# Patient Record
Sex: Female | Born: 1985 | Race: White | Hispanic: No | Marital: Single | Smoking: Current every day smoker
Health system: Southern US, Community
[De-identification: ages and names within clinical notes are randomized; demographics above are authoritative.]

---
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
## 2005-01-05 ENCOUNTER — Emergency Department (HOSPITAL_COMMUNITY): Admission: EM | Admit: 2005-01-05 | Discharge: 2005-01-05 | Payer: Self-pay | Admitting: *Deleted

## 2010-09-19 ENCOUNTER — Emergency Department (HOSPITAL_COMMUNITY)
Admission: EM | Admit: 2010-09-19 | Discharge: 2010-09-19 | Disposition: A | Discharge status: Home / Self Care | Payer: 59 | Attending: Emergency Medicine | Admitting: Emergency Medicine

## 2010-09-19 DIAGNOSIS — R209 Unspecified disturbances of skin sensation: Secondary | ICD-10-CM | POA: Insufficient documentation

## 2010-09-19 DIAGNOSIS — M25519 Pain in unspecified shoulder: Secondary | ICD-10-CM | POA: Insufficient documentation

## 2010-09-19 DIAGNOSIS — Z79899 Other long term (current) drug therapy: Secondary | ICD-10-CM | POA: Insufficient documentation

## 2010-09-19 DIAGNOSIS — R5383 Other fatigue: Secondary | ICD-10-CM | POA: Insufficient documentation

## 2010-09-19 DIAGNOSIS — M62838 Other muscle spasm: Secondary | ICD-10-CM | POA: Insufficient documentation

## 2010-09-19 DIAGNOSIS — M542 Cervicalgia: Secondary | ICD-10-CM | POA: Insufficient documentation

## 2010-09-19 DIAGNOSIS — R51 Headache: Secondary | ICD-10-CM | POA: Insufficient documentation

## 2010-09-19 DIAGNOSIS — R259 Unspecified abnormal involuntary movements: Secondary | ICD-10-CM | POA: Insufficient documentation

## 2010-09-19 DIAGNOSIS — M549 Dorsalgia, unspecified: Secondary | ICD-10-CM | POA: Insufficient documentation

## 2010-09-19 DIAGNOSIS — R5381 Other malaise: Secondary | ICD-10-CM | POA: Insufficient documentation

## 2010-09-19 DIAGNOSIS — R10819 Abdominal tenderness, unspecified site: Secondary | ICD-10-CM | POA: Insufficient documentation

## 2010-09-19 LAB — POCT I-STAT, CHEM 8
Creatinine, Ser: 0.7 mg/dL (ref 0.50–1.10)
Glucose, Bld: 93 mg/dL (ref 70–99)
HCT: 48 % — ABNORMAL HIGH (ref 36.0–46.0)
Hemoglobin: 16.3 g/dL — ABNORMAL HIGH (ref 12.0–15.0)
Sodium: 139 mEq/L (ref 135–145)
TCO2: 24 mmol/L (ref 0–100)

## 2010-09-20 NOTE — Consult Note (Signed)
Donna Riddle, Donna Riddle               ACCOUNT NO.:  [id number]  MEDICAL RECORD NO.:  [id number]  LOCATION:  [facility]                         FACILITY:  [facility]  PHYSICIAN:  Levie Heritage, MD       DATE OF BIRTH:  [birth year]  DATE OF CONSULTATION:  09/19/2010 DATE OF DISCHARGE:                                CONSULTATION   REASON FOR CONSULTATION:  Nonspecific neck tics/dystonia.  REQUESTING PHYSICIAN:  Hassan Buckler. Caporossi, MD  HISTORY OF PRESENT ILLNESS:  The patient is a 25 year old female with no significant past medical history, presents to the emergency room at [facility] complaining of nonspecific neck tics/dystonia and left arm weakness and subjective numbness.  The patient reports that her left arm numbness and weakness have been ongoing for more than a week. Does not recall any injury.  She has been seen for this by Dr. Prince Rome, who reportedly performed a chest x-ray which showed a C4-C5 compression. Since then, the patient has been on Benadryl, prednisone, Robaxin and Vicodin which has partially relieved her symptoms.  Note, the patient has also been going to physical therapy with mild relief of her symptom. Since the night of September 18, 2010, she has been having nonspecific muscle twitching of her vertebral muscles.  The patient denies any acute stress in her life and denies taking any antiemetics.  Denies any radiation exposure.  Denies any history of neurodegenerative disease.  PAST MEDICAL HISTORY:  None.  MEDICATIONS:  Robaxin, Vicodin, Benadryl and prednisone.  ALLERGIES:  CODEINE nonspecific reaction.  FAMILY HISTORY:  Significant for hypertension.  No neurological symptoms or disease is known in her family.  SOCIAL HISTORY:  She smokes 1 pack cigarettes a day.  Denies any alcohol or illicit drug use.  She is divorced and lives in [place] with her 21- year-old daughter.  REVIEW OF SYSTEMS:  Negative expect per HPI.  PHYSICAL EXAMINATION:  VITAL SIGNS:   Blood pressure 133/88, pulse 70, respiratory rate 20 and temperature 98.5. MENTAL STATUS:  Alert and oriented x3.  Carries out a two-step commands. CRANIAL NERVES:  Eyes, pupil round and reactive to light.  Extraocular movement intact.  Visual fields intact.  No nystagmus. FACE:  Symmetric.  No facial droop.  No slurred speech.  Facial sensation intact.  Finger-to-nose and heel-to-shin intact.  Gait giveaway weakness on her left side. MOTOR:  Random and sudden jerks of neck in different fashion, not typical for torticollis or myoclonus, possibly tics.  Left arm is giveaway weakness to avoid pain in the neck that obvious weakness otherwise. LUNGS:  Clear to auscultation. CARDIOVASCULAR:  Regular rate and rhythm.  No murmurs, rubs or gallops.  LABORATORY DATA:  Sodium 139, potassium 4.1, chloride 103, bicarb 24, BUN 14, creatinine 0.7 and glucose 93.  Hemoglobin 16.3, hematocrit 48.0.    ASSESSMENT AND PLAN: 4. A 25 year old female with nonspecific neck tics/dystonia.  A     history and clinical scenario is not suggestive of any primary     CNS/PNS pathology.  I would recommend Klonopin 0.5 mg p.o. b.i.d.     for symptomatic treatment and recommend outpatient followup     evaluation if signs and symptoms persist  after several weeks.  The     patient should follow up with Dr. Regino Schultze, telephone number [phone].  I would also recommend nonemergent EEG (doubt partial     seizure however). 2. I would recommend left arm EMG/NCS as an outpatient for questionable     history of C-spine disk issues (though this is not typical either). 3. No additional recommendations at this time.  Thank you very much for your consult.     ______________________________ Levie Heritage, MD     WS/MEDQ  D:  09/19/2010  T:  09/20/2010  Job:  [id number]  Electronically Signed by Levie Heritage MD on 09/20/2010 02:16:51 PM

## 2010-09-26 ENCOUNTER — Encounter: Payer: Self-pay | Admitting: Neurology

## 2010-10-08 ENCOUNTER — Ambulatory Visit: Payer: 59 | Admitting: Neurology

## 2014-02-09 ENCOUNTER — Emergency Department (HOSPITAL_COMMUNITY)
Admission: EM | Admit: 2014-02-09 | Discharge: 2014-02-09 | Disposition: A | Discharge status: Home / Self Care | Payer: Worker's Compensation | Attending: Emergency Medicine | Admitting: Emergency Medicine

## 2014-02-09 ENCOUNTER — Encounter (HOSPITAL_COMMUNITY): Payer: Self-pay | Admitting: Emergency Medicine

## 2014-02-09 ENCOUNTER — Emergency Department (HOSPITAL_COMMUNITY): Payer: Worker's Compensation

## 2014-02-09 DIAGNOSIS — X58XXXA Exposure to other specified factors, initial encounter: Secondary | ICD-10-CM | POA: Diagnosis not present

## 2014-02-09 DIAGNOSIS — S99911A Unspecified injury of right ankle, initial encounter: Secondary | ICD-10-CM | POA: Diagnosis present

## 2014-02-09 DIAGNOSIS — Z720 Tobacco use: Secondary | ICD-10-CM | POA: Diagnosis not present

## 2014-02-09 DIAGNOSIS — Y9289 Other specified places as the place of occurrence of the external cause: Secondary | ICD-10-CM | POA: Diagnosis not present

## 2014-02-09 DIAGNOSIS — Y9389 Activity, other specified: Secondary | ICD-10-CM | POA: Insufficient documentation

## 2014-02-09 DIAGNOSIS — Y998 Other external cause status: Secondary | ICD-10-CM | POA: Diagnosis not present

## 2014-02-09 DIAGNOSIS — S93401A Sprain of unspecified ligament of right ankle, initial encounter: Secondary | ICD-10-CM | POA: Diagnosis not present

## 2014-02-09 NOTE — Discharge Instructions (Signed)
1. Medications: Tylenol as needed for pain, usual home medications 2. Treatment: rest, drink plenty of fluids, ice, elevate, weightbearing as tolerated 3. Follow Up: Please followup with your primary doctor as needed for discussion of your diagnoses and further evaluation after today's visit; if you do not have a primary care doctor use the resource guide provided to find one; Please return to the ER for worsening symptoms, inability to walk,    Ankle Sprain An ankle sprain is an injury to the strong, fibrous tissues (ligaments) that hold the bones of your ankle joint together.  CAUSES An ankle sprain is usually caused by a fall or by twisting your ankle. Ankle sprains most commonly occur when you step on the outer edge of your foot, and your ankle turns inward. People who participate in sports are more prone to these types of injuries.  SYMPTOMS   Pain in your ankle. The pain may be present at rest or only when you are trying to stand or walk.  Swelling.  Bruising. Bruising may develop immediately or within 1 to 2 days after your injury.  Difficulty standing or walking, particularly when turning corners or changing directions. DIAGNOSIS  Your caregiver will ask you details about your injury and perform a physical exam of your ankle to determine if you have an ankle sprain. During the physical exam, your caregiver will press on and apply pressure to specific areas of your foot and ankle. Your caregiver will try to move your ankle in certain ways. An X-ray exam may be done to be sure a bone was not broken or a ligament did not separate from one of the bones in your ankle (avulsion fracture).  TREATMENT  Certain types of braces can help stabilize your ankle. Your caregiver can make a recommendation for this. Your caregiver may recommend the use of medicine for pain. If your sprain is severe, your caregiver may refer you to a surgeon who helps to restore function to parts of your skeletal system  (orthopedist) or a physical therapist. HOME CARE INSTRUCTIONS   Apply ice to your injury for 1-2 days or as directed by your caregiver. Applying ice helps to reduce inflammation and pain.  Put ice in a plastic bag.  Place a towel between your skin and the bag.  Leave the ice on for 15-20 minutes at a time, every 2 hours while you are awake.  Only take over-the-counter or prescription medicines for pain, discomfort, or fever as directed by your caregiver.  Elevate your injured ankle above the level of your heart as much as possible for 2-3 days.  If your caregiver recommends crutches, use them as instructed. Gradually put weight on the affected ankle. Continue to use crutches or a cane until you can walk without feeling pain in your ankle.  If you have a plaster splint, wear the splint as directed by your caregiver. Do not rest it on anything harder than a pillow for the first 24 hours. Do not put weight on it. Do not get it wet. You may take it off to take a shower or bath.  You may have been given an elastic bandage to wear around your ankle to provide support. If the elastic bandage is too tight (you have numbness or tingling in your foot or your foot becomes cold and blue), adjust the bandage to make it comfortable.  If you have an air splint, you may blow more air into it or let air out to make it more  comfortable. You may take your splint off at night and before taking a shower or bath. Wiggle your toes in the splint several times per day to decrease swelling. SEEK MEDICAL CARE IF:   You have rapidly increasing bruising or swelling.  Your toes feel extremely cold or you lose feeling in your foot.  Your pain is not relieved with medicine. SEEK IMMEDIATE MEDICAL CARE IF:  Your toes are numb or blue.  You have severe pain that is increasing. MAKE SURE YOU:   Understand these instructions.  Will watch your condition.  Will get help right away if you are not doing well or get  worse. Document Released: 12/29/2004 Document Revised: 09/23/2011 Document Reviewed: 01/10/2011 [facility] Patient Information 2015 [employer] [place]. This information is not intended to replace advice given to you by your health care provider. Make sure you discuss any questions you have with your health care provider.

## 2014-02-09 NOTE — ED Notes (Signed)
Per [facility], pt fell out of a truck, twisted right ankle, heard a pop. Swelling noted to right ankle. No obvious deformity. Pt able to wiggle toes. Pt states toes are numb. Hurts with movement.

## 2014-02-09 NOTE — ED Provider Notes (Signed)
CSN: [id number]     Arrival date & time 02/09/14  1958 History  This chart was scribed for Dierdre ForthHannah Deniese Oberry, PA-C with Richardean Canalavid H Yao, MD by Tonye RoyaltyJoshua Chen, ED Scribe. This patient was seen in room TR11C/TR11C and the patient's care was started at 9:03 PM.    Chief Complaint  Patient presents with  . Ankle Pain   The history is provided by the patient and medical records. No language interpreter was used.    HPI Comments: Donna CastillaMelissa S Riddle is a 29 y.o. female who presents to the Emergency Department complaining of right ankle pain with onset PTA. She states she mistepped in EMS truck and her ankle rolled to the right side. She describes the pain to her posterior ankle as burning and the pain to the lateral ankle as throbbing. She states all the toes in her right foot are tingling. She is unsure if her heel is numb. She reports one prior right ankle sprain years ago and denies other problems with that ankle. She states she applied ice to it. She states she has not tried to ambulate.   History reviewed. No pertinent past medical history. History reviewed. No pertinent past surgical history. No family history on file. History  Substance Use Topics  . Smoking status: Current Every Day Smoker  . Smokeless tobacco: Not on file  . Alcohol Use: Yes   OB History    No data available     Review of Systems  Constitutional: Negative for fever and chills.  Gastrointestinal: Negative for nausea and vomiting.  Musculoskeletal: Positive for joint swelling and arthralgias. Negative for back pain, neck pain and neck stiffness.  Skin: Negative for wound.  Neurological: Positive for numbness.  Hematological: Does not bruise/bleed easily.  Psychiatric/Behavioral: The patient is not nervous/anxious.   All other systems reviewed and are negative.     Allergies  Codeine; Ambien; Robaxin; and Vicodin  Home Medications   Prior to Admission medications   Medication Sig Start Date End Date Taking?  Authorizing Provider  ClonazePAM (KLONOPIN PO) Take by mouth.   Yes Historical Provider, MD  Meloxicam (MOBIC PO) Take by mouth.   Yes Historical Provider, MD  TRAMADOL HCL PO Take by mouth.   Yes Historical Provider, MD   BP 122/67 mmHg  Pulse 71  Temp(Src) 98.3 F (36.8 C) (Oral)  Resp 16  Ht 5\' 1"  (1.549 m)  Wt 170 lb (77.111 kg)  BMI 32.14 kg/m2  SpO2 97%  LMP 01/12/2014 Physical Exam  Constitutional: She appears well-developed and well-nourished. No distress.  HENT:  Head: Normocephalic and atraumatic.  Eyes: Conjunctivae are normal.  Neck: Normal range of motion.  Cardiovascular: Normal rate, regular rhythm and intact distal pulses.   Capillary refill < 3 sec  Pulmonary/Chest: Effort normal and breath sounds normal.  Musculoskeletal: She exhibits tenderness. She exhibits no edema.  ROM: decreased flexion and extension in right ankle Tenderness to palption of the lateral and medial joint line Mild swelling over lateral malleolus Mild tenderness to palpation of the achilles tendon without palpable defect  Negative Thompson's test  Neurological: She is alert. Coordination normal.  Sensation intact to dull and sharp in RLE Strength 2/5 in the RLE with resisted dorsiflexion and plantarflexion due to pain  Skin: Skin is warm and dry. She is not diaphoretic.  No tenting of the skin  Psychiatric: She has a normal mood and affect.  Nursing note and vitals reviewed.   ED Course  Procedures (including critical care time)  DIAGNOSTIC STUDIES: Oxygen Saturation is 98% on room air, normal by my interpretation.    COORDINATION OF CARE: 9:12 PM Discussed treatment plan with patient at beside, including brace and crutches. She declines pain medication. The patient agrees with the plan and has no further questions at this time.   Labs Review Labs Reviewed - No data to display  Imaging Review Dg Ankle Complete Right  02/09/2014   CLINICAL DATA:  Patient rolled ankle stepping  out of truck. Pain laterally  EXAM: RIGHT ANKLE - COMPLETE 3+ VIEW  COMPARISON:  None.  FINDINGS: Frontal, oblique, and lateral views were obtained. There is mild swelling laterally. There is no appreciable fracture or joint effusion. Ankle mortise appears intact. No erosive change.  IMPRESSION: Mild swelling laterally.  No fracture.  Mortise intact   Electronically Signed   By: Bretta Bang M.D.   On: 02/09/2014 20:26     EKG Interpretation None      MDM   Final diagnoses:  Right ankle sprain, initial encounter   Donna Riddle presents with right ankle pain.  Patient X-Ray negative for obvious fracture or dislocation. Pain managed in ED. Pt advised to follow up with orthopedics if symptoms persist for possibility of missed fracture diagnosis. Patient given brace while in ED, conservative therapy recommended and discussed. Patient will be dc home & is agreeable with above plan.  I have personally reviewed patient's vitals, nursing note and any pertinent labs or imaging.  I performed an undressed physical exam.    It has been determined that no acute conditions requiring further emergency intervention are present at this time. The patient/guardian have been advised of the diagnosis and plan. I reviewed all labs and imaging including any potential incidental findings. We have discussed signs and symptoms that warrant return to the ED and they are listed in the discharge instructions.    Vital signs are stable at discharge.   BP 122/67 mmHg  Pulse 71  Temp(Src) 98.3 F (36.8 C) (Oral)  Resp 16  Ht  (1.549 m)  Wt 170 lb (77.111 kg)  BMI 32.14 kg/m2  SpO2 97%  LMP 01/12/2014  I personally performed the services described in this documentation, which was scribed in my presence. The recorded information has been reviewed and is accurate.    Dahlia Client Rodolfo Notaro, PA-C 02/09/14 2148  Richardean Canal, MD 02/09/14 2352

## 2014-02-09 NOTE — Progress Notes (Signed)
Orthopedic Tech Progress Note Patient Details:  Donna CastillaMelissa S Riddle 1985/10/10 [id number]  Ortho Devices Type of Ortho Device: ASO, Crutches Ortho Device/Splint Location: RLE Ortho Device/Splint Interventions: Ordered, Application   [name], [name] 02/09/2014, 9:29 PM

## 2017-08-10 ENCOUNTER — Ambulatory Visit (INDEPENDENT_AMBULATORY_CARE_PROVIDER_SITE_OTHER): Payer: 59 | Admitting: Orthopaedic Surgery

## 2017-08-10 ENCOUNTER — Ambulatory Visit (INDEPENDENT_AMBULATORY_CARE_PROVIDER_SITE_OTHER): Payer: 59

## 2017-08-10 DIAGNOSIS — M25571 Pain in right ankle and joints of right foot: Secondary | ICD-10-CM

## 2017-08-10 NOTE — Progress Notes (Signed)
Office Visit Note   Patient: Donna Riddle           Date of Birth: January 31, 1985           MRN: [id number] Visit Date: 08/10/2017              Requested by: No referring provider defined for this encounter. PCP: Patient, No Pcp Per   Assessment & Plan: Visit Diagnoses:  1. Pain in right ankle and joints of right foot     Plan: Impression is chronic right ankle pain suspicious for osteochondral lesion versus intra-articular loose bodies from previous ankle joint.  Patient has failed conservative treatment with physical therapy and bracing.  At this point recommend MRI to rule out structural abnormalities.  Follow-up after the MRI.  Follow-Up Instructions: Return in about 2 weeks (around 08/24/2017).   Orders:  Orders Placed This Encounter  Procedures  . XR Ankle Complete Right  . MR Ankle Right w/o contrast   No orders of the defined types were placed in this encounter.     Procedures: No procedures performed   Clinical Data: No additional findings.   Subjective: Chief Complaint  Patient presents with  . Right Ankle - Pain    This is a 32 year old female comes in with 3-year history of right ankle pain that is worse on the anterolateral mechanical swelling.  She originally had an injury to her right ankle about 3 years ago that was treated conservatively with physical therapy and rest.  This failed to improve significantly.  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  She endorses swelling.  Denies any numbness and tingling.  She also has discomfort and pain posteriorly along the Achilles.   Review of Systems  Constitutional: Negative.   HENT: Negative.   Eyes: Negative.   Respiratory: Negative.   Cardiovascular: Negative.   Endocrine: Negative.   Musculoskeletal: Negative.   Neurological: Negative.   Hematological: Negative.   Psychiatric/Behavioral: Negative.   All other systems reviewed and  are negative.    Objective: Vital Signs: There were no vitals taken for this visit.  Physical Exam  Constitutional: She is oriented to person, place, and time. She appears well-developed and well-nourished.  HENT:  Head: Normocephalic and atraumatic.  Eyes: EOM are normal.  Neck: Neck supple.  Pulmonary/Chest: Effort normal.  Abdominal: Soft.  Neurological: She is alert and oriented to person, place, and time.  Skin: Skin is warm. Capillary refill takes less than 2 seconds.  Psychiatric: She has a normal mood and affect. Her behavior is normal. Judgment and thought content normal.  Nursing note and vitals reviewed.   Ortho Exam Right ankle exam shows no significant swelling or asymmetry.  She does have tenderness along the lateral ankle gutter and ATFL ligament.  Anterior drawer is stable but there is significant crepitus and grinding that causes pain.  She does have a mild Achilles contracture. Specialty Comments:  No specialty comments available.  Imaging: Xr Ankle Complete Right  Result Date: 08/10/2017 Cortical irregularity in the lateral gutter concerning for loose body or osteochondral lesion    PMFS History: There are no active problems to display for this patient.  No past medical history on file.  No family history on file.  No past surgical history on file. Social History   Occupational History  . Not on file  Tobacco Use  . Smoking status: Current Every Day Smoker  Substance  and Sexual Activity  . Alcohol use: Yes  . Drug use: Not on file  . Sexual activity: Not on file

## 2017-08-11 ENCOUNTER — Telehealth (INDEPENDENT_AMBULATORY_CARE_PROVIDER_SITE_OTHER): Payer: Self-pay | Admitting: Orthopaedic Surgery

## 2017-08-11 NOTE — Telephone Encounter (Signed)
Sedentary work for now

## 2017-08-11 NOTE — Telephone Encounter (Signed)
Please advise,

## 2017-08-11 NOTE — Telephone Encounter (Signed)
Patient called needing a note for work because she can not fit into her boot. Patient asked what does Dr Roda ShuttersXu want her do concerning returning back to work. Patient said she is on herr feet all the time and have to left 65 to 70 lbs of equipment. The number to contact patient is [phone]

## 2017-08-12 ENCOUNTER — Encounter (INDEPENDENT_AMBULATORY_CARE_PROVIDER_SITE_OTHER): Payer: Self-pay

## 2017-08-12 NOTE — Telephone Encounter (Signed)
Emailed to [email or web address] per patients request.

## 2017-08-20 ENCOUNTER — Ambulatory Visit
Admission: RE | Admit: 2017-08-20 | Discharge: 2017-08-20 | Disposition: A | Discharge status: Home / Self Care | Payer: 59 | Source: Ambulatory Visit | Attending: Orthopaedic Surgery | Admitting: Orthopaedic Surgery

## 2017-08-20 DIAGNOSIS — M25571 Pain in right ankle and joints of right foot: Secondary | ICD-10-CM

## 2017-08-25 ENCOUNTER — Encounter (INDEPENDENT_AMBULATORY_CARE_PROVIDER_SITE_OTHER): Payer: Self-pay | Admitting: Orthopaedic Surgery

## 2017-08-25 ENCOUNTER — Ambulatory Visit (INDEPENDENT_AMBULATORY_CARE_PROVIDER_SITE_OTHER): Payer: 59 | Admitting: Orthopaedic Surgery

## 2017-08-25 DIAGNOSIS — M25571 Pain in right ankle and joints of right foot: Secondary | ICD-10-CM | POA: Diagnosis not present

## 2017-08-25 NOTE — Progress Notes (Signed)
   Office Visit Note   Patient: Donna Riddle           Date of Birth: 08/26/1985           MRN: [id number] Visit Date: 08/25/2017              Requested by: No referring provider defined for this encounter. PCP: Patient, No Pcp Per   Assessment & Plan: Visit Diagnoses:  1. 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  Given these findings I would like to refer her to Dr. Lajoyce Cornersuda for discussion of possible surgery.  Sedentary work note given today.  Patient in agreement with the plan.  Based on patient's history it sounds like this was a work-related injury.  Follow-Up Instructions: Return if symptoms worsen or fail to improve.   Orders:  No orders of the defined types were placed in this encounter.  No orders of the defined types were placed in this encounter.     Procedures: No procedures performed   Clinical Data: No additional findings.   Subjective: No chief complaint on file.   Donna Riddle is here to review her MRI.   Review of Systems   Objective: Vital Signs: There were no vitals taken for this visit.  Physical Exam  Ortho Exam Right ankle exam shows pain with palpation in the ATFL region as well as a mildly positive anterior drawer. Specialty Comments:  No specialty comments available.  Imaging: No results found.   PMFS History: There are no active problems to display for this patient.  No past medical history on file.  No family history on file.  No past surgical history on file. Social History   Occupational History  . Not on file  Tobacco Use  . Smoking status: Current Every Day Smoker  Substance and Sexual Activity  . Alcohol use: Yes  . Drug use: Not on file  . Sexual activity: Not on file

## 2017-08-30 ENCOUNTER — Ambulatory Visit (INDEPENDENT_AMBULATORY_CARE_PROVIDER_SITE_OTHER): Payer: 59 | Admitting: Orthopedic Surgery

## 2017-08-30 ENCOUNTER — Encounter (INDEPENDENT_AMBULATORY_CARE_PROVIDER_SITE_OTHER): Payer: Self-pay | Admitting: Orthopedic Surgery

## 2017-08-30 VITALS — Ht 61.0 in | Wt 170.0 lb

## 2017-08-30 DIAGNOSIS — M25571 Pain in right ankle and joints of right foot: Secondary | ICD-10-CM | POA: Diagnosis not present

## 2017-08-30 MED ORDER — METHYLPREDNISOLONE ACETATE 40 MG/ML IJ SUSP
40.0000 mg | INTRAMUSCULAR | Status: AC | PRN
Start: 2017-08-30 — End: 2017-08-30
  Administered 2017-08-30: 40 mg via INTRA_ARTICULAR

## 2017-08-30 MED ORDER — LIDOCAINE HCL 1 % IJ SOLN
2.0000 mL | INTRAMUSCULAR | Status: AC | PRN
  Administered 2017-08-30: 2 mL

## 2017-08-30 NOTE — Progress Notes (Signed)
Office Visit Note   Patient: Donna Riddle           Date of Birth: [birth year]           MRN: [id number] Visit Date: 08/30/2017              Requested by: No referring provider defined for this encounter. PCP: Patient, No Pcp Per  Chief Complaint  Patient presents with  . Right Ankle - Pain      HPI: Patient is a 32 year old woman who was seen for initial evaluation of chronic right ankle pain anterior laterally.  Patient states that 3 years ago she had a bad sprain when she got out of the truck she stepped on a great the great broke and her foot fell down about 5 to 6 inches.  Patient states she has had pain anteriorly over the lateral joint line.  She is currently on sedentary level of work.  Assessment & Plan: Visit Diagnoses:  1. Pain in right ankle and joints of right foot     Plan: Patient will continue her sedentary level of work reevaluate in 3 weeks to see how she does with the injection.  Patient may benefit from arthroscopic debridement.  Follow-Up Instructions: Return in about 3 weeks (around 09/20/2017).   Ortho Exam  Patient is alert, oriented, no adenopathy, well-dressed, normal affect, normal respiratory effort. Examination patient has an antalgic gait she has good pulses.  The peroneal and posterior tibial tendons are nontender to palpation she is tender to palpation of the anterior talofibular ligament as well as the anterior joint line laterally.  She has a negative anterior drawer her ankle is stable she has good subtalar motion there is no redness no cellulitis.  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  Imaging: No results found. No images are attached to the encounter.  Labs: No  results found for: HGBA1C, ESRSEDRATE, CRP, LABURIC, REPTSTATUS, GRAMSTAIN, CULT, LABORGA   No results found for: ALBUMIN, PREALBUMIN, LABURIC  Body mass index is 32.12 kg/m.  Orders:  No orders of the defined types were placed in this encounter.  No orders of the defined types were placed in this encounter.    Procedures: Medium Joint Inj: R ankle on 08/30/2017 11:13 AM Indications: pain and diagnostic evaluation Details: 22 G 1.5 in needle, anteromedial approach Medications: 2 mL lidocaine 1 %; 40 mg methylPREDNISolone acetate 40 MG/ML Outcome: tolerated well, no immediate complications Procedure, treatment alternatives, risks and benefits explained, specific risks discussed. Consent was given by the patient. Immediately prior to procedure a time out was called to verify the correct patient, procedure, equipment, support staff and site/side marked as required. Patient was prepped and draped in the usual sterile fashion.      Clinical Data: No additional findings.  ROS:  All other systems negative, except as noted in the HPI. Review of Systems  Objective: Vital Signs: Ht 5\' 1"  (1.549 m)   Wt 170 lb (77.1 kg)   BMI 32.12 kg/m   Specialty Comments:  No specialty comments available.  PMFS History: There are no active problems to display for this patient.  History reviewed. No pertinent past medical history.  History reviewed. No pertinent family history.  History reviewed. No pertinent  surgical history. Social History   Occupational History  . Not on file  Tobacco Use  . Smoking status: Current Every Day Smoker  . Smokeless tobacco: Never Used  Substance and Sexual Activity  . Alcohol use: Yes  . Drug use: Not on file  . Sexual activity: Not on file

## 2017-09-22 ENCOUNTER — Encounter (INDEPENDENT_AMBULATORY_CARE_PROVIDER_SITE_OTHER): Payer: Self-pay | Admitting: Orthopedic Surgery

## 2017-09-22 ENCOUNTER — Ambulatory Visit (INDEPENDENT_AMBULATORY_CARE_PROVIDER_SITE_OTHER): Payer: 59 | Admitting: Orthopedic Surgery

## 2017-09-22 VITALS — Ht 61.0 in | Wt 170.0 lb

## 2017-09-22 DIAGNOSIS — M25871 Other specified joint disorders, right ankle and foot: Secondary | ICD-10-CM | POA: Diagnosis not present

## 2017-09-22 DIAGNOSIS — M25571 Pain in right ankle and joints of right foot: Secondary | ICD-10-CM

## 2017-09-22 NOTE — Progress Notes (Signed)
   Office Visit Note   Patient: Donna Riddle           Date of Birth: [birth year]           MRN: [id number] Visit Date: 09/22/2017              Requested by: No referring provider defined for this encounter. PCP: Patient, No Pcp Per  Chief Complaint  Patient presents with  . Right Foot - Pain, Follow-up      HPI: Patient is a 32 year old woman who presents complaining of persistent pain in the right ankle.  She states with the intra-articular injection she had about 1 week of relief of pain and stiffness.  She states that the pain now is worse than it was before she is using Tylenol for pain she states that she is still out of work and still has pain.  Assessment & Plan: Visit Diagnoses:  1. Pain in right ankle and joints of right foot   2. Impingement of right ankle joint     Plan: Discussed that with her temporary relief with the ankle injection she most likely could benefit from arthroscopic debridement for the impingement.  Discussed that she could achieve up to 75% relief of the ankle pain.  Patient states she would like to proceed with surgical intervention she states she is still out of work.  Will plan to follow-up possibly 1 week after surgery.  Follow-Up Instructions: Return in about 2 weeks (around 10/06/2017).   Ortho Exam  Patient is alert, oriented, no adenopathy, well-dressed, normal affect, normal respiratory effort. Examination patient has a good dorsalis pedis pulse she has good ankle and subtalar motion.  She has minimal tenderness palpation over the peroneal tendons and posterior tibial tendon there is no tenderness to palpation over the Achilles.  She is primarily tender to palpation over the anterior joint line.  She does have a swollen ankle.  Imaging: No results found. No images are attached to the encounter.  Labs: No results found for: HGBA1C, ESRSEDRATE, CRP, LABURIC, REPTSTATUS, GRAMSTAIN, CULT, LABORGA   No results found for: ALBUMIN,  PREALBUMIN, LABURIC  Body mass index is 32.12 kg/m.  Orders:  No orders of the defined types were placed in this encounter.  No orders of the defined types were placed in this encounter.    Procedures: No procedures performed  Clinical Data: No additional findings.  ROS:  All other systems negative, except as noted in the HPI. Review of Systems  Objective: Vital Signs: Ht 5\' 1"  (1.549 m)   Wt 170 lb (77.1 kg)   BMI 32.12 kg/m   Specialty Comments:  No specialty comments available.  PMFS History: There are no active problems to display for this patient.  History reviewed. No pertinent past medical history.  History reviewed. No pertinent family history.  History reviewed. No pertinent surgical history. Social History   Occupational History  . Not on file  Tobacco Use  . Smoking status: Current Every Day Smoker  . Smokeless tobacco: Never Used  Substance and Sexual Activity  . Alcohol use: Yes  . Drug use: Not on file  . Sexual activity: Not on file

## 2017-09-28 ENCOUNTER — Encounter: Payer: Self-pay | Admitting: Orthopedic Surgery

## 2017-09-28 DIAGNOSIS — M19171 Post-traumatic osteoarthritis, right ankle and foot: Secondary | ICD-10-CM

## 2017-10-06 ENCOUNTER — Encounter (INDEPENDENT_AMBULATORY_CARE_PROVIDER_SITE_OTHER): Payer: Self-pay | Admitting: Physician Assistant

## 2017-10-06 ENCOUNTER — Ambulatory Visit (INDEPENDENT_AMBULATORY_CARE_PROVIDER_SITE_OTHER): Payer: 59 | Admitting: Physician Assistant

## 2017-10-06 VITALS — Ht 61.0 in | Wt 170.0 lb

## 2017-10-06 DIAGNOSIS — M25871 Other specified joint disorders, right ankle and foot: Secondary | ICD-10-CM

## 2017-10-06 NOTE — Progress Notes (Signed)
   Office Visit Note   Patient: Donna Riddle           Date of Birth: 05-Aug-1985           MRN: [id number] Visit Date: 10/06/2017              Requested by: No referring provider defined for this encounter. PCP: Patient, No Pcp Per  Chief Complaint  Patient presents with  . Right Foot - Routine Post Op      HPI: Patient is a 32 year old female who is seen for postoperative follow-up following right ankle arthroscopic debridement on 09/28/2017.  She reports that her pain has significantly improved following the debridement.  She is still feeling sore but overall much improved.  She has been utilizing a cane and weightbearing as tolerated in a postoperative shoe.  Assessment & Plan: Visit Diagnoses:  1. Impingement of right ankle joint     Plan: Sutures were harvested this visit.  The patient will gradually increase her ambulation to tolerance.  Instructed patient to elevate the right lower extremity numerous times during the day to help with her edema.  She will follow-up here in 2 weeks or sooner should she have any difficulties in the interim.  Follow-Up Instructions: Return in about 2 weeks (around 10/20/2017).   Ortho Exam  Patient is alert, oriented, no adenopathy, well-dressed, normal affect, normal respiratory effort. Examination of the right ankle shows portal sites healing well and sutures were harvested this visit.  No signs of cellulitis.  No erythema.  Mild edema.  Imaging: No results found. No images are attached to the encounter.  Labs: No results found for: HGBA1C, ESRSEDRATE, CRP, LABURIC, REPTSTATUS, GRAMSTAIN, CULT, LABORGA   No results found for: ALBUMIN, PREALBUMIN, LABURIC  Body mass index is 32.12 kg/m.  Orders:  No orders of the defined types were placed in this encounter.  No orders of the defined types were placed in this encounter.    Procedures: No procedures performed  Clinical Data: No additional findings.  ROS:  All other  systems negative, except as noted in the HPI. Review of Systems  Objective: Vital Signs: Ht 5\' 1"  (1.549 m)   Wt 170 lb (77.1 kg)   BMI 32.12 kg/m   Specialty Comments:  No specialty comments available.  PMFS History: There are no active problems to display for this patient.  History reviewed. No pertinent past medical history.  History reviewed. No pertinent family history.  History reviewed. No pertinent surgical history. Social History   Occupational History  . Not on file  Tobacco Use  . Smoking status: Current Every Day Smoker  . Smokeless tobacco: Never Used  Substance and Sexual Activity  . Alcohol use: Yes  . Drug use: Not on file  . Sexual activity: Not on file

## 2017-10-07 ENCOUNTER — Telehealth (INDEPENDENT_AMBULATORY_CARE_PROVIDER_SITE_OTHER): Payer: Self-pay | Admitting: Orthopedic Surgery

## 2017-10-07 NOTE — Telephone Encounter (Signed)
Dr Lajoyce Corners is aware. Noted. Thank you

## 2017-10-07 NOTE — Telephone Encounter (Signed)
Patient called stating that she had spoke with Shawn yesterday at her appointment regarding a extended note about disability.  CB#[phone].  Thank you.

## 2017-10-12 ENCOUNTER — Telehealth (INDEPENDENT_AMBULATORY_CARE_PROVIDER_SITE_OTHER): Payer: Self-pay | Admitting: Orthopedic Surgery

## 2017-10-12 NOTE — Telephone Encounter (Signed)
Patient called advised she will pick up note when it's ready. The note is concerning extending her disability. Patient said her ankle is swollen and she is in a lot of pain. Patient said she put weight on her ankle Saturday and again yesterday.  The number to contact patient is [phone]

## 2017-10-12 NOTE — Telephone Encounter (Signed)
I called and sw pt per [facility] and advised that she needed to come in for appt if having increased pain and swelling needs eval to make sure everything is ok post surgery. appt on Thursday at 10:45 can address need for extended leave at that time.

## 2017-10-14 ENCOUNTER — Encounter (INDEPENDENT_AMBULATORY_CARE_PROVIDER_SITE_OTHER): Payer: Self-pay | Admitting: Orthopedic Surgery

## 2017-10-14 ENCOUNTER — Ambulatory Visit (INDEPENDENT_AMBULATORY_CARE_PROVIDER_SITE_OTHER): Payer: 59 | Admitting: Orthopedic Surgery

## 2017-10-14 VITALS — Ht 61.0 in | Wt 170.0 lb

## 2017-10-14 DIAGNOSIS — M25871 Other specified joint disorders, right ankle and foot: Secondary | ICD-10-CM

## 2017-10-14 NOTE — Progress Notes (Signed)
   Office Visit Note   Patient: Donna Riddle           Date of Birth: Mar 31, 1985           MRN: [id number] Visit Date: 10/14/2017              Requested by: No referring provider defined for this encounter. PCP: Patient, No Pcp Per  Chief Complaint  Patient presents with  . Right Ankle - Routine Post Op    09/28/17 right ankle scope and debridement       HPI: Patient is a 32 year old woman who presents in follow-up for right ankle arthroscopy for debridement of impingement.  Patient states she was doing well recently somebody stepped on her foot and she has been having some increased numbness over the great toe and some increased scar tissue over the medial portal.  Assessment & Plan: Visit Diagnoses:  1. Unilateral primary osteoarthritis, left knee     Plan: Recommended scar massage increasing activities as tolerated evaluate for return to work at follow-up patient states she still having swelling recommended compression stockings.  Patient states she is unable to return to work with her inability to ambulate and stand for prolonged periods of time.  Follow-Up Instructions: Return in about 4 weeks (around 11/11/2017).   Ortho Exam  Patient is alert, oriented, no adenopathy, well-dressed, normal affect, normal respiratory effort. Examination patient does have some increase in scar tissue over the medial portal.  There is no redness no cellulitis no effusion no signs of infection there is no crepitation with range of motion of the ankle.  There is minimal swelling.  Imaging: No results found. No images are attached to the encounter.  Labs: No results found for: HGBA1C, ESRSEDRATE, CRP, LABURIC, REPTSTATUS, GRAMSTAIN, CULT, LABORGA   No results found for: ALBUMIN, PREALBUMIN, LABURIC  Body mass index is 32.12 kg/m.  Orders:  No orders of the defined types were placed in this encounter.  No orders of the defined types were placed in this encounter.     Procedures: No procedures performed  Clinical Data: No additional findings.  ROS:  All other systems negative, except as noted in the HPI. Review of Systems  Objective: Vital Signs: Ht 5\' 1"  (1.549 m)   Wt 170 lb (77.1 kg)   BMI 32.12 kg/m   Specialty Comments:  No specialty comments available.  PMFS History: There are no active problems to display for this patient.  History reviewed. No pertinent past medical history.  History reviewed. No pertinent family history.  History reviewed. No pertinent surgical history. Social History   Occupational History  . Not on file  Tobacco Use  . Smoking status: Current Every Day Smoker  . Smokeless tobacco: Never Used  Substance and Sexual Activity  . Alcohol use: Yes  . Drug use: Not on file  . Sexual activity: Not on file

## 2017-10-20 ENCOUNTER — Ambulatory Visit (INDEPENDENT_AMBULATORY_CARE_PROVIDER_SITE_OTHER): Payer: 59 | Admitting: Physician Assistant

## 2017-11-01 ENCOUNTER — Encounter (INDEPENDENT_AMBULATORY_CARE_PROVIDER_SITE_OTHER): Payer: Self-pay | Admitting: Physician Assistant

## 2017-11-01 ENCOUNTER — Ambulatory Visit (INDEPENDENT_AMBULATORY_CARE_PROVIDER_SITE_OTHER): Payer: 59 | Admitting: Physician Assistant

## 2017-11-01 VITALS — Ht 61.0 in | Wt 170.0 lb

## 2017-11-01 DIAGNOSIS — M25871 Other specified joint disorders, right ankle and foot: Secondary | ICD-10-CM

## 2017-11-01 NOTE — Progress Notes (Unsigned)
   Office Visit Note   Patient: Donna Riddle           Date of Birth: 1985-12-05           MRN: [id number] Visit Date: 11/01/2017              Requested by: No referring provider defined for this encounter. PCP: Patient, No Pcp Per  No chief complaint on file.     HPI: ***  Assessment & Plan: Visit Diagnoses: No diagnosis found.  Plan: ***  Follow-Up Instructions: No follow-ups on file.   Ortho Exam  Patient is alert, oriented, no adenopathy, well-dressed, normal affect, normal respiratory effort. ***  Imaging: No results found. No images are attached to the encounter.  Labs: No results found for: HGBA1C, ESRSEDRATE, CRP, LABURIC, REPTSTATUS, GRAMSTAIN, CULT, LABORGA   No results found for: ALBUMIN, PREALBUMIN, LABURIC  There is no height or weight on file to calculate BMI.  Orders:  No orders of the defined types were placed in this encounter.  No orders of the defined types were placed in this encounter.    Procedures: No procedures performed  Clinical Data: No additional findings.  ROS:  All other systems negative, except as noted in the HPI. Review of Systems  Objective: Vital Signs: There were no vitals taken for this visit.  Specialty Comments:  No specialty comments available.  PMFS History: There are no active problems to display for this patient.  No past medical history on file.  No family history on file.  No past surgical history on file. Social History   Occupational History  . Not on file  Tobacco Use  . Smoking status: Current Every Day Smoker  . Smokeless tobacco: Never Used  Substance and Sexual Activity  . Alcohol use: Yes  . Drug use: Not on file  . Sexual activity: Not on file

## 2017-11-01 NOTE — Progress Notes (Signed)
Office Visit Note   Patient: Donna Riddle           Date of Birth: 1985-08-11           MRN: [id number] Visit Date: 11/01/2017              Requested by: No referring provider defined for this encounter. PCP: Patient, No Pcp Per  Chief Complaint  Patient presents with  . Right Ankle - Routine Post Op    09/28/17 right ankle scope and debridement       HPI: Patient is a 32 year old female who who is seen for postoperative follow-up following right ankle arthroscopy and debridement.  She reports that her discomfort in the right ankle is continuing to gradually improve.  She is still experiencing some localized swelling at the ankle and has been wearing a compression stocking.  She has started to try and wear her work boot but was having trouble getting the boot on due to some swelling.  She would like to try some taping of the ankle for discomfort and we are fine with her doing this.  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  Assessment & Plan: Visit Diagnoses:  1. Impingement of right ankle joint     Plan: Patient continue compression stocking to the right leg for edema control.  She was instructed and Achilles tendon stretching exercises.  She can use some taping to the ankle if this helps with her activities.  She may begin walking including walking on a treadmill and may begin lifting heavier weights to try and improve her endurance and strength prior to returning to work.  We will plan to see her back in 2 weeks or sooner should she have difficulty in the interim.  Follow-Up Instructions: Return in about 2 weeks (around 11/15/2017).   Ortho Exam  Patient is alert, oriented, no adenopathy, well-dressed, normal affect, normal respiratory effort. Right ankle port sites are healing well.  She has dorsiflexion to 0 and plantar flexion to about 15degrees.  She has some mild discomfort with dorsum plantarflexion but feels better with varus and valgus  movements.  She has some mild localized ankle edema without pitting.  There are no signs of cellulitis.  Imaging: No results found. No images are attached to the encounter.  Labs: No results found for: HGBA1C, ESRSEDRATE, CRP, LABURIC, REPTSTATUS, GRAMSTAIN, CULT, LABORGA   No results found for: ALBUMIN, PREALBUMIN, LABURIC  Body mass index is 32.12 kg/m.  Orders:  No orders of the defined types were placed in this encounter.  No orders of the defined types were placed in this encounter.    Procedures: No procedures performed  Clinical Data: No additional findings.  ROS:  All other systems negative, except as noted in the HPI. Review of Systems  Objective: Vital Signs: Ht 5\' 1"  (1.549 m)   Wt 170 lb (77.1 kg)   BMI 32.12 kg/m   Specialty Comments:  No specialty comments available.  PMFS History: There are no active problems to display for this patient.  History reviewed. No pertinent past medical history.  History reviewed. No pertinent family history.  History reviewed. No pertinent surgical history. Social History   Occupational History  . Not on file  Tobacco Use  . Smoking status: Current Every Day Smoker  . Smokeless tobacco: Never Used  Substance and Sexual Activity  . Alcohol use: Yes  . Drug use: Not on file  .  Sexual activity: Not on file

## 2017-11-15 ENCOUNTER — Ambulatory Visit (INDEPENDENT_AMBULATORY_CARE_PROVIDER_SITE_OTHER): Payer: 59 | Admitting: Physician Assistant

## 2017-11-16 ENCOUNTER — Ambulatory Visit (INDEPENDENT_AMBULATORY_CARE_PROVIDER_SITE_OTHER): Payer: 59 | Admitting: Physician Assistant

## 2017-11-22 ENCOUNTER — Encounter (INDEPENDENT_AMBULATORY_CARE_PROVIDER_SITE_OTHER): Payer: Self-pay | Admitting: Physician Assistant

## 2017-11-22 ENCOUNTER — Ambulatory Visit (INDEPENDENT_AMBULATORY_CARE_PROVIDER_SITE_OTHER): Payer: 59 | Admitting: Physician Assistant

## 2017-11-22 VITALS — Ht 61.0 in | Wt 170.0 lb

## 2017-11-22 DIAGNOSIS — M25871 Other specified joint disorders, right ankle and foot: Secondary | ICD-10-CM

## 2017-11-22 DIAGNOSIS — M25571 Pain in right ankle and joints of right foot: Secondary | ICD-10-CM

## 2017-11-22 NOTE — Progress Notes (Signed)
Office Visit Note   Patient: Donna Riddle           Date of Birth: March 14, 1985           MRN: [id number] Visit Date: 11/22/2017              Requested by: No referring provider defined for this encounter. PCP: Patient, No Pcp Per  Chief Complaint  Patient presents with  . Right Ankle - Routine Post Op    09/28/17 right ankle scope and debridement       HPI: Patient is a 32 year old female who is seen for postoperative follow-up following right ankle arthroscopic debridement on 09/28/2017.  She has been wearing a regular shoe and has tried compression and does note that her swelling has improved.  She is overall feeling much better.  She did try to begin wearing her work boots and did obtain new inserts for the boots but reports that she developed significant burning pain after about 11 hours and edema and had to take the boots off.  She reports that she would be required to wear the boots for up to 13-1/2 hours, for a 12 and half hour shift.  We wanted to try her and they have a silver compression stockings but she reports an allergy to any type of will or animal based fibers.  She reports she still having difficulty going up and down stairs but has been working on her ankle range of motion including doing Achilles stretches as instructed.  She still lacks some dorsiflexion actively.  She reports pain over the anterior medial joint line primarily.  She has been taking some meloxicam but this is significantly improving her symptoms.  She does not feel that she could work a full shift yet and reports that she cannot return until she is full duty.  Assessment & Plan: Visit Diagnoses:  1. Impingement of right ankle joint   2. Pain in right ankle and joints of right foot     Plan: Recommend the patient continue to try to progressively work and wearing her work boots.  We do recommend continued compression for edema control and elevation.  Recommend continued range of motion exercises to  stretch her Achilles.  She will follow-up here in 2 weeks for reevaluation or sooner should she have difficulties in the interim.    Follow-Up Instructions: Return in about 2 weeks (around 12/06/2017).   Ortho Exam  Patient is alert, oriented, no adenopathy, well-dressed, normal affect, normal respiratory effort. Rocket rolling the right ankle port sites are well-healed and without signs of infection or cellulitis.  The right ankle still lacks 5 to 10 degrees of dorsiflexion she has good plantarflexion.  There is no instability.  She has mild localized edema about the foot and ankle when compared with the contralateral leg.  Imaging: No results found. No images are attached to the encounter.  Labs: No results found for: HGBA1C, ESRSEDRATE, CRP, LABURIC, REPTSTATUS, GRAMSTAIN, CULT, LABORGA   No results found for: ALBUMIN, PREALBUMIN, LABURIC  Body mass index is 32.12 kg/m.  Orders:  No orders of the defined types were placed in this encounter.  No orders of the defined types were placed in this encounter.    Procedures: No procedures performed  Clinical Data: No additional findings.  ROS:  All other systems negative, except as noted in the HPI. Review of Systems  Objective: Vital Signs: Ht 5\' 1"  (1.549 m)   Wt 170 lb (77.1 kg)  BMI 32.12 kg/m   Specialty Comments:  No specialty comments available.  PMFS History: There are no active problems to display for this patient.  History reviewed. No pertinent past medical history.  History reviewed. No pertinent family history.  History reviewed. No pertinent surgical history. Social History   Occupational History  . Not on file  Tobacco Use  . Smoking status: Current Every Day Smoker  . Smokeless tobacco: Never Used  Substance and Sexual Activity  . Alcohol use: Yes  . Drug use: Not on file  . Sexual activity: Not on file

## 2017-12-03 ENCOUNTER — Encounter (INDEPENDENT_AMBULATORY_CARE_PROVIDER_SITE_OTHER): Payer: Self-pay | Admitting: Physician Assistant

## 2017-12-03 ENCOUNTER — Ambulatory Visit (INDEPENDENT_AMBULATORY_CARE_PROVIDER_SITE_OTHER): Payer: 59 | Admitting: Physician Assistant

## 2017-12-03 VITALS — Ht 61.0 in | Wt 170.0 lb

## 2017-12-03 DIAGNOSIS — M25571 Pain in right ankle and joints of right foot: Secondary | ICD-10-CM

## 2017-12-03 DIAGNOSIS — M25871 Other specified joint disorders, right ankle and foot: Secondary | ICD-10-CM

## 2017-12-03 NOTE — Progress Notes (Signed)
   Office Visit Note   Patient: Donna Riddle           Date of Birth: Dec 21, 1985           MRN: [id number] Visit Date: 12/03/2017              Requested by: No referring provider defined for this encounter. PCP: Patient, No Pcp Per  Chief Complaint  Patient presents with  . Right Ankle - Routine Post Op    09/28/17 right ankle scope and debridement       HPI: The patient is a 32 year old female who is seen for postoperative follow-up following a right ankle arthroscopic debridement on 09/26/2017.  She is wearing some compression and has transitioned into her work boots and is tolerating these much better.  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  Her pain is markedly improved.  She is having much less swelling issues and her motion in the ankle is markedly improved as well.  Assessment & Plan: Visit Diagnoses:  1. Impingement of right ankle joint   2. Pain in right ankle and joints of right foot     Plan: The patient is released to return to work full duty with no restrictions at this time.  She is instructed to continue working on her ankle range of motion continue compression and follow-up on an as-needed basis should she develop any concerns or questions  Follow-Up Instructions: No follow-ups on file.   Ortho Exam  Patient is alert, oriented, no adenopathy, well-dressed, normal affect, normal respiratory effort. The right ankle ports are well-healed.  There are no signs of infections or cellulitis.  The right ankle dorsiflexion is markedly improved and she has good plantar flexion.  She does report more tightness now on plantarflexion.  There is no instability.  Her edema is resolved.  Imaging: No results found. No images are attached to the encounter.  Labs: No results found for: HGBA1C, ESRSEDRATE, CRP, LABURIC, REPTSTATUS, GRAMSTAIN, CULT, LABORGA   No results found  for: ALBUMIN, PREALBUMIN, LABURIC  Body mass index is 32.12 kg/m.  Orders:  No orders of the defined types were placed in this encounter.  No orders of the defined types were placed in this encounter.    Procedures: No procedures performed  Clinical Data: No additional findings.  ROS:  All other systems negative, except as noted in the HPI. Review of Systems  Objective: Vital Signs: Ht 5\' 1"  (1.549 m)   Wt 170 lb (77.1 kg)   BMI 32.12 kg/m   Specialty Comments:  No specialty comments available.  PMFS History: There are no active problems to display for this patient.  No past medical history on file.  No family history on file.  No past surgical history on file. Social History   Occupational History  . Not on file  Tobacco Use  . Smoking status: Current Every Day Smoker  . Smokeless tobacco: Never Used  Substance and Sexual Activity  . Alcohol use: Yes  . Drug use: Not on file  . Sexual activity: Not on file

## 2019-07-14 ENCOUNTER — Other Ambulatory Visit (HOSPITAL_COMMUNITY)
Admission: RE | Admit: 2019-07-14 | Discharge: 2019-07-14 | Disposition: A | Discharge status: Home / Self Care | Payer: 59 | Source: Ambulatory Visit | Attending: Physician Assistant | Admitting: Physician Assistant

## 2019-07-14 ENCOUNTER — Other Ambulatory Visit: Payer: Self-pay | Admitting: Physician Assistant

## 2019-07-14 DIAGNOSIS — Z124 Encounter for screening for malignant neoplasm of cervix: Secondary | ICD-10-CM | POA: Diagnosis present

## 2019-07-18 LAB — CYTOLOGY - PAP: Diagnosis: NEGATIVE

## 2019-08-23 IMAGING — MR MR ANKLE*R* W/O CM
6 series · 38 of 40 positions shown · non-contrast
Comparison: Right ankle x-rays dated August 10, 2017.

CLINICAL DATA: Chronic lateral ankle pain and limited range of
motion.

EXAM:
MRI OF THE RIGHT ANKLE WITHOUT CONTRAST
TECHNIQUE: Multiplanar, multisequence MR imaging of the ankle was performed. No
intravenous contrast was administered.

[Series 3: T2 fat-sat · axial · 3.0mm · 0.53mm/px · z∈[-97,+74]mm · 7 of 45 slices shown (1 of 2)]
[im 1/45]
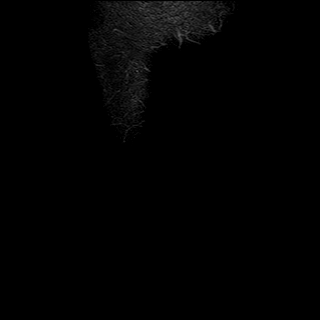
[im 8/45]
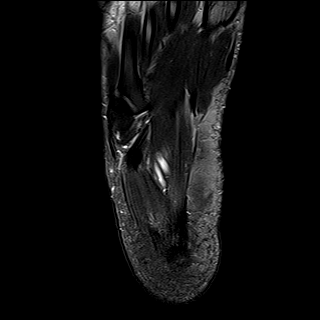
[im 15/45]
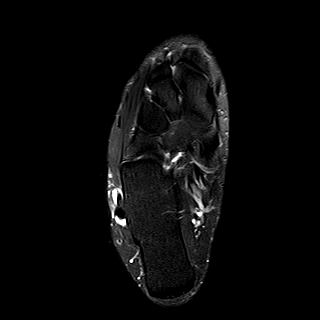
[im 23/45]
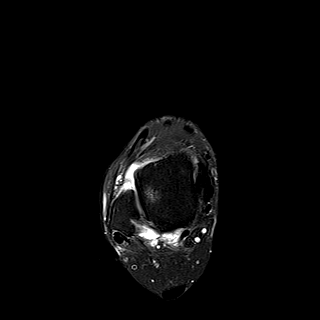
[im 30/45]
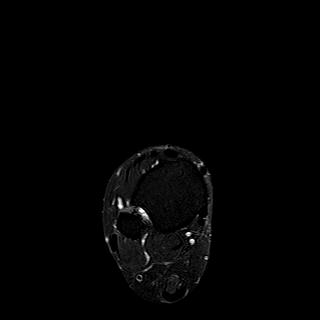
[im 37/45]
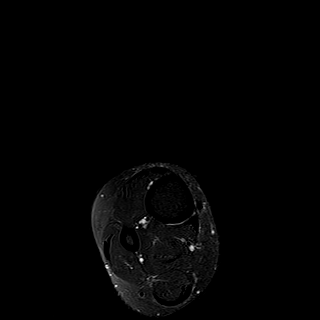
[im 45/45]
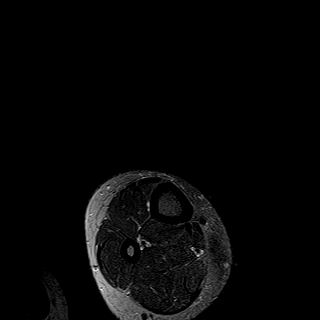

[Series 4: PD fat-sat · axial · 3.0mm · 0.44mm/px · z∈[-97,+74]mm · 8 of 45 slices shown]
[im 1/45]
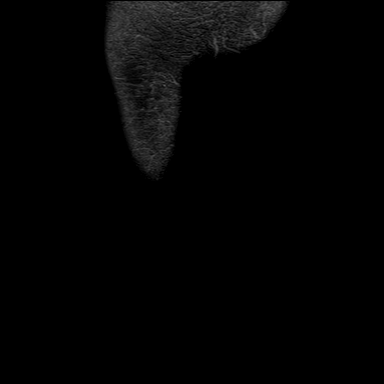
[im 7/45]
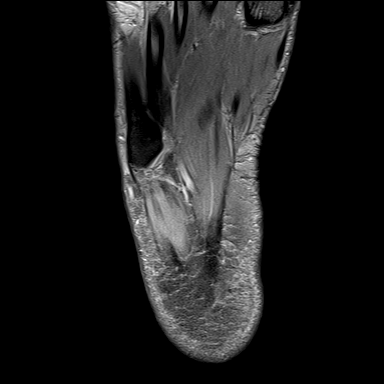
[im 13/45]
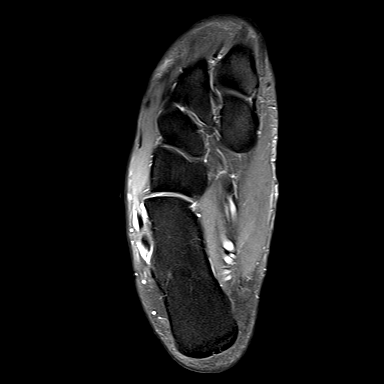
[im 19/45]
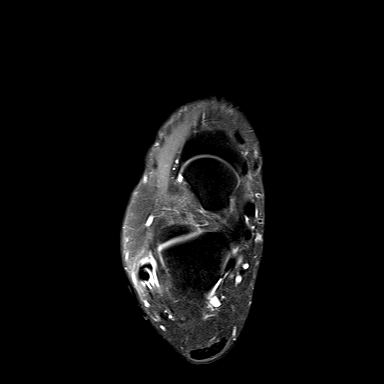
[im 26/45]
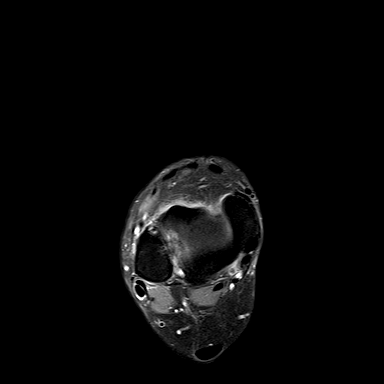
[im 32/45]
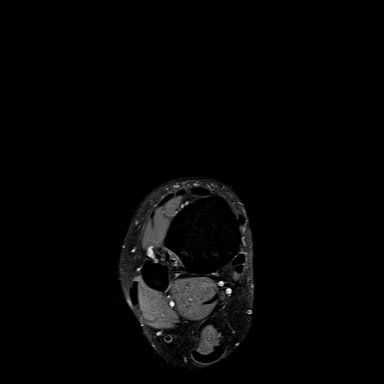
[im 38/45]
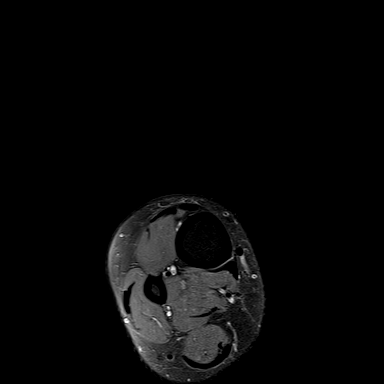
[im 45/45]
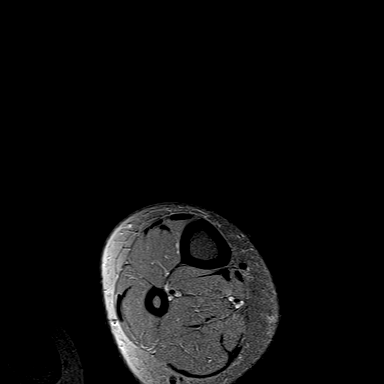

[Series 6: T1 · sagittal · 4.0mm · 0.56mm/px · 5 of 26 slices shown (1 of 2)]
[im 1/26]
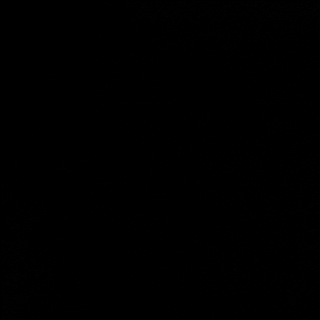
[im 7/26]
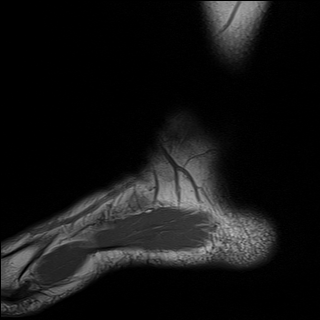
[im 13/26]
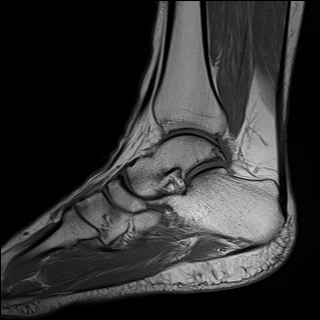
[im 19/26]
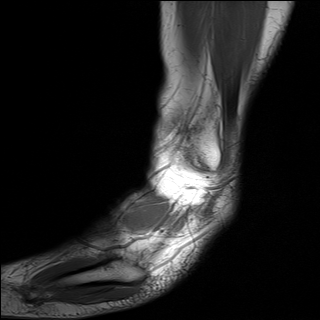
[im 26/26]
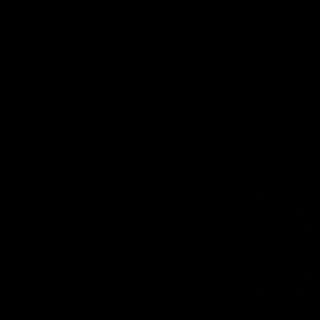

[Series 7: T1 · axial · 3.0mm · 0.53mm/px · z∈[-97,+74]mm · 8 of 45 slices shown (2 of 2)]
[im 1/45]
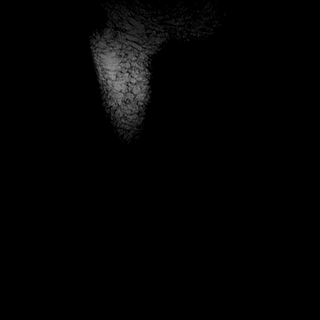
[im 7/45]
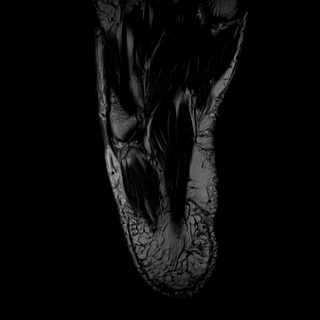
[im 13/45]
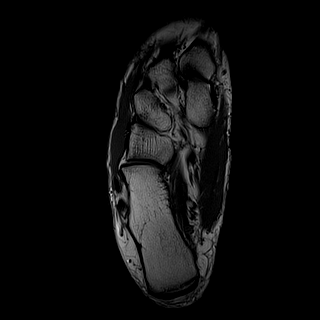
[im 19/45]
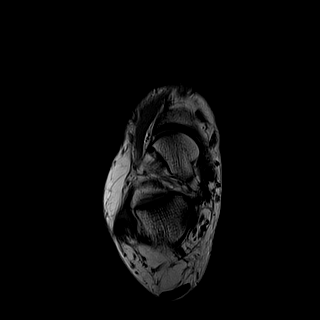
[im 26/45]
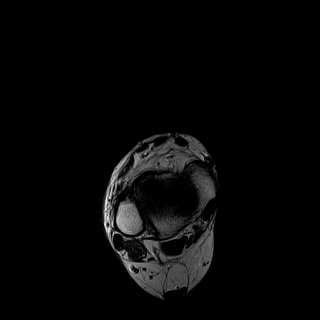
[im 32/45]
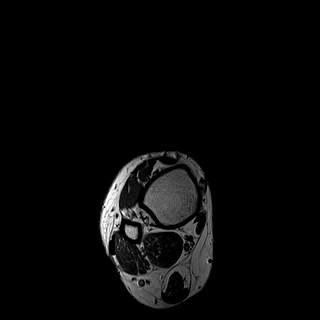
[im 38/45]
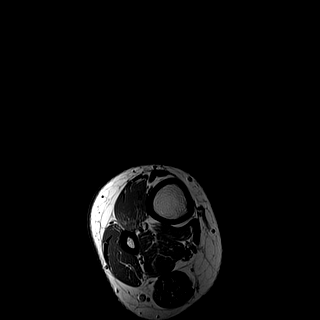
[im 45/45]
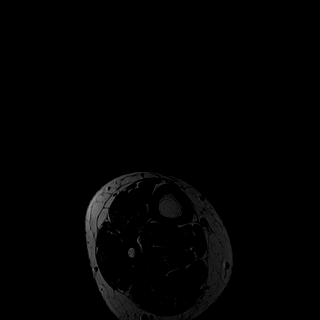

[Series 8: STIR · sagittal · 4.0mm · 0.35mm/px · 3 of 26 slices shown]
[im 1/26]
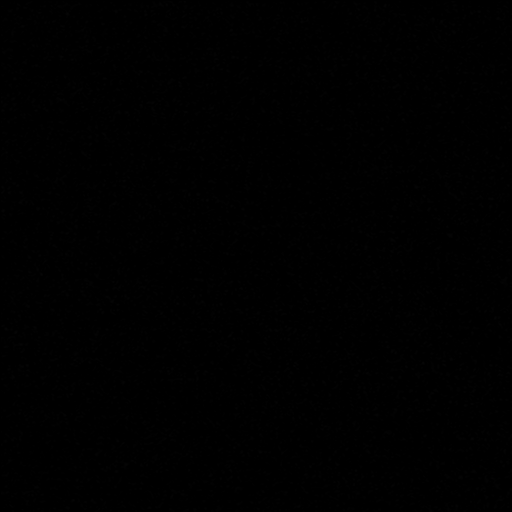
[im 7/26]
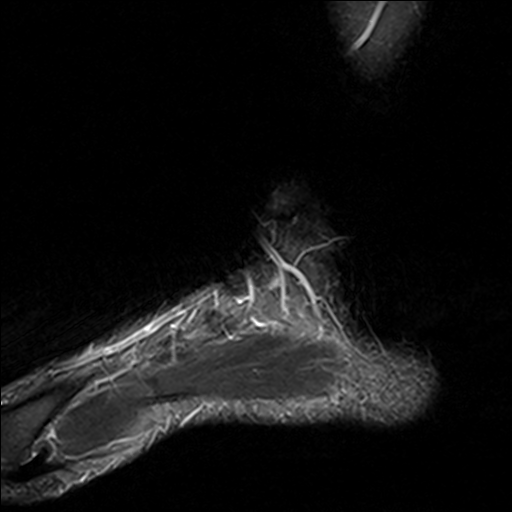
[im 13/26]
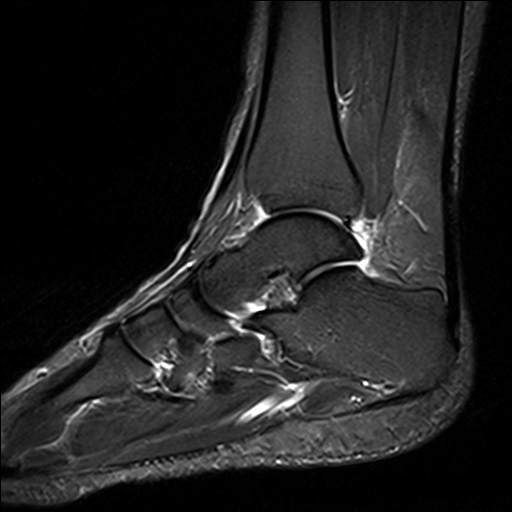

[Series 9: T2 fat-sat · coronal · 3.0mm · 0.50mm/px · 7 of 42 slices shown (2 of 2)]
[im 1/42]
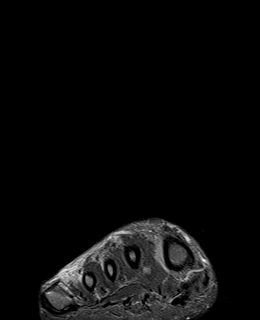
[im 7/42]
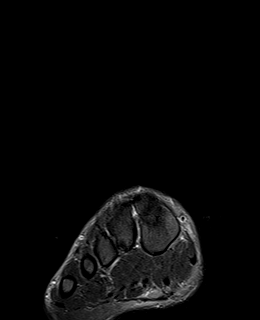
[im 14/42]
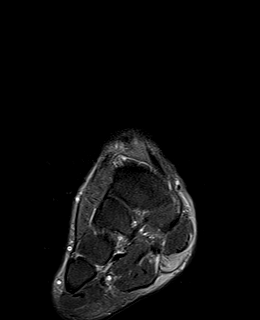
[im 21/42]
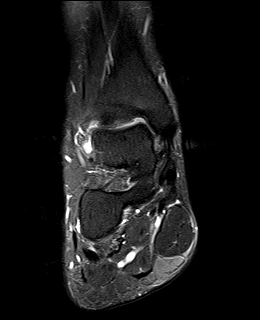
[im 28/42]
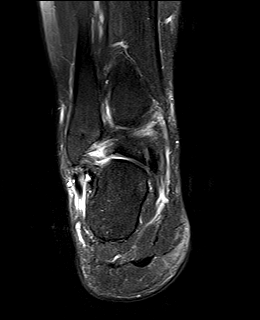
[im 35/42]
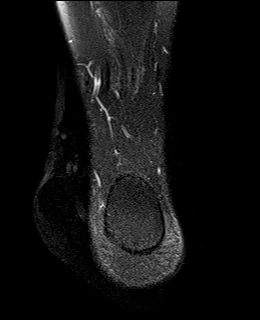
[im 42/42]
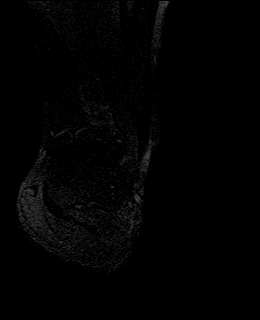

[38 of 40 positions shown; findings below may reference images not displayed]

FINDINGS: TENDONS

Peroneal: Peroneal longus tendon intact. Focal low-grade partial
tear of the mid peroneal brevis tendon. Small amount of fluid in the
peroneal tendon sheaths.

Posteromedial: Posterior tibial tendon intact. Flexor hallucis
longus tendon intact. Flexor digitorum longus tendon intact.

Anterior: Tibialis anterior tendon intact. Extensor hallucis longus
tendon intact Extensor digitorum longus tendon intact.

Achilles:  Intact.

Plantar Fascia: Intact.

LIGAMENTS

Lateral: Anterior talofibular ligament is chronically torn.
Thickening of the calcaneofibular ligament likely reflects prior
injury. The ligament remains intact. Posterior talofibular ligament
intact. Anterior and posterior tibiofibular ligaments intact.

Medial: Deltoid ligament intact. Spring ligament intact. Lisfranc
ligament intact

CARTILAGE

Ankle Joint: No joint effusion. Normal ankle mortise. 5 mm focal
full-thickness cartilage defect over the lateral talar dome with
underlying marrow edema.

Subtalar Joints/Sinus Tarsi: Normal subtalar joints. No subtalar
joint effusion. Normal sinus tarsi.

Bones: No suspicious marrow signal abnormality. No fracture or
dislocation.

Soft Tissue: No soft tissue mass or fluid collection.
IMPRESSION: 1. Small osteochondral lesion of the lateral talar dome with
overlying 5 mm focal full-thickness cartilage defect.
2. Mild peroneal longus and brevis tenosynovitis with focal
low-grade partial tear of the mid peroneal brevis tendon.
3. Chronically torn anterior talofibular ligament.

## 2020-01-20 ENCOUNTER — Telehealth: Payer: 59 | Admitting: Orthopedic Surgery

## 2020-01-20 DIAGNOSIS — R0989 Other specified symptoms and signs involving the circulatory and respiratory systems: Secondary | ICD-10-CM

## 2020-01-20 MED ORDER — AMOXICILLIN-POT CLAVULANATE 875-125 MG PO TABS: 1.0000 | ORAL_TABLET | Freq: Two times a day (BID) | ORAL | 0 refills | Status: AC

## 2020-01-20 NOTE — Progress Notes (Signed)

## 2021-01-29 ENCOUNTER — Other Ambulatory Visit: Payer: Self-pay

## 2021-01-29 ENCOUNTER — Other Ambulatory Visit: Payer: Self-pay | Admitting: Occupational Medicine

## 2021-01-29 ENCOUNTER — Ambulatory Visit: Payer: Self-pay

## 2021-01-29 DIAGNOSIS — M546 Pain in thoracic spine: Secondary | ICD-10-CM
